# Patient Record
Sex: Male | Born: 1990 | Race: White | Hispanic: No | Marital: Single | Smoking: Former smoker
Health system: Southern US, Community
[De-identification: ages and names within clinical notes are randomized; demographics above are authoritative.]

## PROBLEM LIST (undated history)

## (undated) HISTORY — PX: SKIN CANCER EXCISION: SHX779

## (undated) HISTORY — PX: TONSILLECTOMY: SUR1361

---

## 2012-04-09 ENCOUNTER — Ambulatory Visit: Payer: Self-pay | Admitting: Internal Medicine

## 2013-12-14 ENCOUNTER — Emergency Department: Payer: Self-pay | Admitting: Emergency Medicine

## 2014-08-26 ENCOUNTER — Emergency Department: Payer: Self-pay | Admitting: Emergency Medicine

## 2014-08-26 LAB — URINALYSIS, COMPLETE
BILIRUBIN, UR: NEGATIVE
Bacteria: NONE SEEN
Glucose,UR: NEGATIVE mg/dL (ref 0–75)
LEUKOCYTE ESTERASE: NEGATIVE
Nitrite: NEGATIVE
Ph: 7 (ref 4.5–8.0)
Protein: 30
RBC,UR: 28 /HPF (ref 0–5)
Specific Gravity: 1.016 (ref 1.003–1.030)
Squamous Epithelial: NONE SEEN
WBC UR: 3 /HPF (ref 0–5)

## 2014-08-26 LAB — COMPREHENSIVE METABOLIC PANEL
ALT: 20 U/L
ANION GAP: 10 (ref 7–16)
Albumin: 4.6 g/dL (ref 3.4–5.0)
Alkaline Phosphatase: 77 U/L
BILIRUBIN TOTAL: 1.5 mg/dL — AB (ref 0.2–1.0)
BUN: 14 mg/dL (ref 7–18)
CALCIUM: 9 mg/dL (ref 8.5–10.1)
Chloride: 107 mmol/L (ref 98–107)
Co2: 23 mmol/L (ref 21–32)
Creatinine: 1.13 mg/dL (ref 0.60–1.30)
EGFR (African American): >60
GLUCOSE: 126 mg/dL — AB (ref 65–99)
OSMOLALITY: 281 (ref 275–301)
Potassium: 3.5 mmol/L (ref 3.5–5.1)
SGOT(AST): 28 U/L (ref 15–37)
Sodium: 140 mmol/L (ref 136–145)
Total Protein: 7.3 g/dL (ref 6.4–8.2)

## 2014-08-26 LAB — CBC WITH DIFFERENTIAL/PLATELET
BASOS ABS: 0.1 10*3/uL (ref 0.0–0.1)
BASOS PCT: 1.1 %
EOS ABS: 0.5 10*3/uL (ref 0.0–0.7)
Eosinophil %: 4.7 %
HCT: 43.1 % (ref 40.0–52.0)
HGB: 14.4 g/dL (ref 13.0–18.0)
Lymphocyte #: 4.4 10*3/uL — ABNORMAL HIGH (ref 1.0–3.6)
Lymphocyte %: 39.5 %
MCH: 31.5 pg (ref 26.0–34.0)
MCHC: 33.3 g/dL (ref 32.0–36.0)
MCV: 95 fL (ref 80–100)
Monocyte #: 0.9 x10 3/mm (ref 0.2–1.0)
Monocyte %: 7.7 %
Neutrophil #: 5.2 10*3/uL (ref 1.4–6.5)
Neutrophil %: 47 %
PLATELETS: 389 10*3/uL (ref 150–440)
RBC: 4.56 10*6/uL (ref 4.40–5.90)
RDW: 12.9 % (ref 11.5–14.5)
WBC: 11.1 10*3/uL — ABNORMAL HIGH (ref 3.8–10.6)

## 2014-12-21 IMAGING — CT CT CERVICAL SPINE WITHOUT CONTRAST
3 of 5 series · 10 of 33 positions shown, 12 images · non-contrast
Comparison: None.

CLINICAL DATA: Headache and neck pain after falling from pull up
bar.

EXAM:
CT HEAD WITHOUT CONTRAST
CT CERVICAL SPINE WITHOUT CONTRAST
TECHNIQUE: Multidetector CT imaging of the head and cervical spine was
performed following the standard protocol without intravenous
contrast. Multiplanar CT image reconstructions of the cervical spine
were also generated.

[Series 6: sag bone · sagittal · 0.18mm/px · 5 of 46 slices shown, 6 images]
[im 16/46  bone]
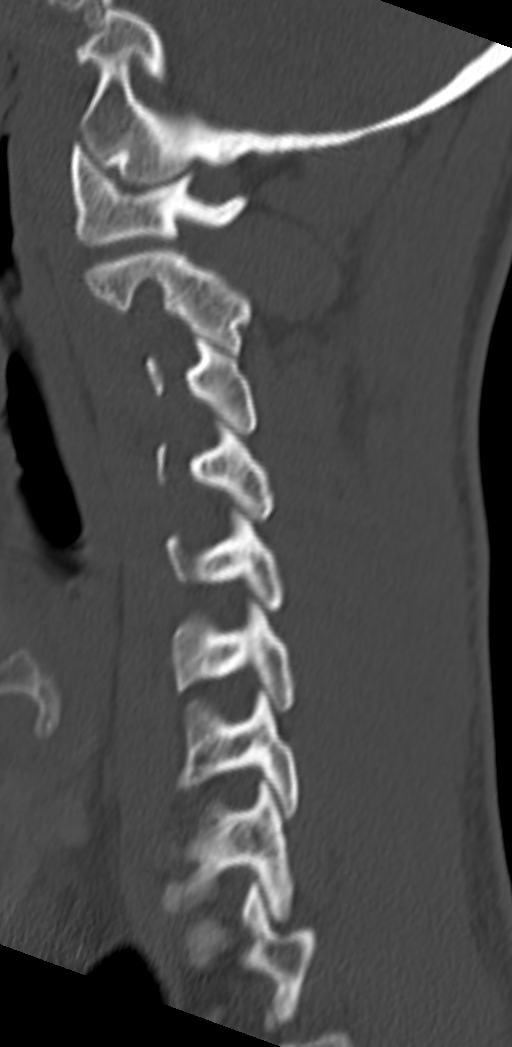
[im 19/46  bone]
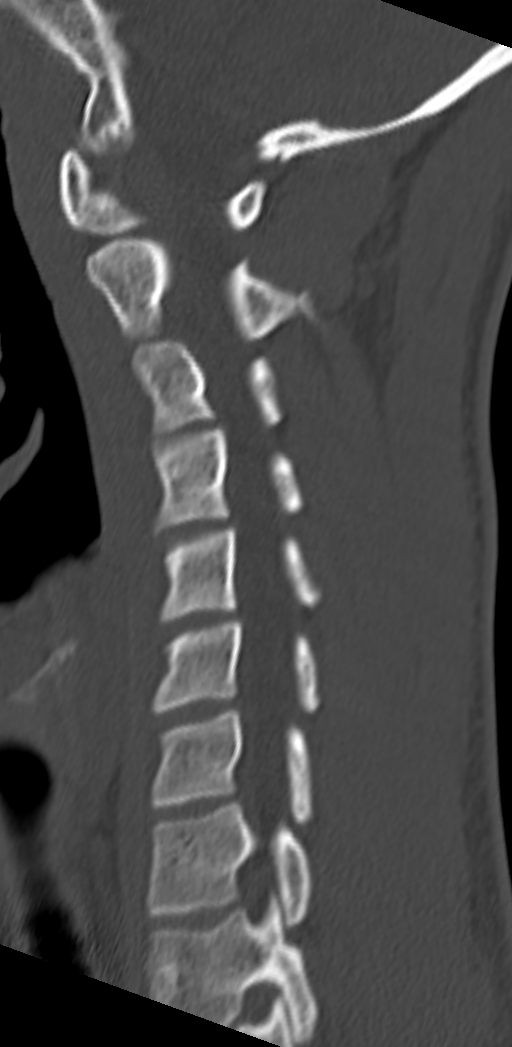
[im 23/46  soft-tissue]
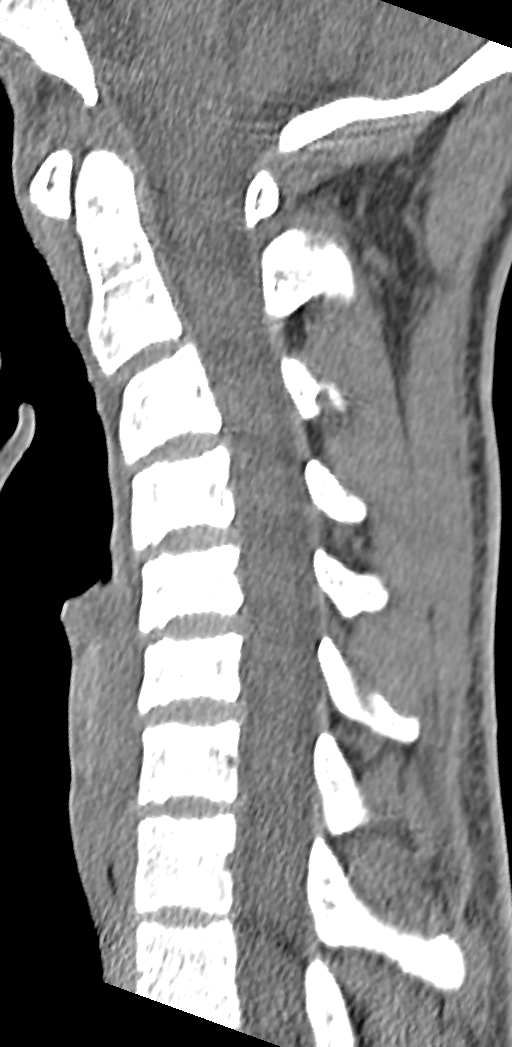
[im 23/46  bone]
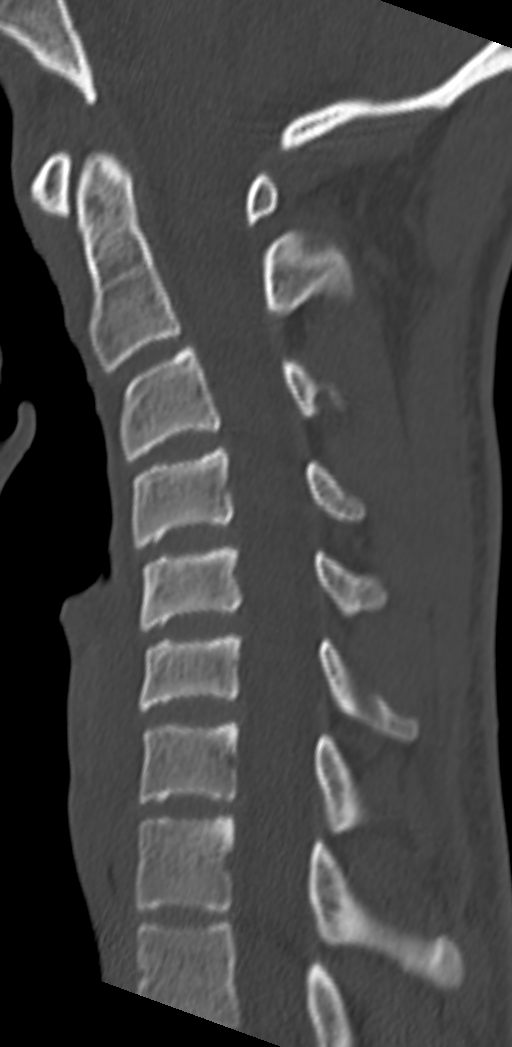
[im 27/46  bone]
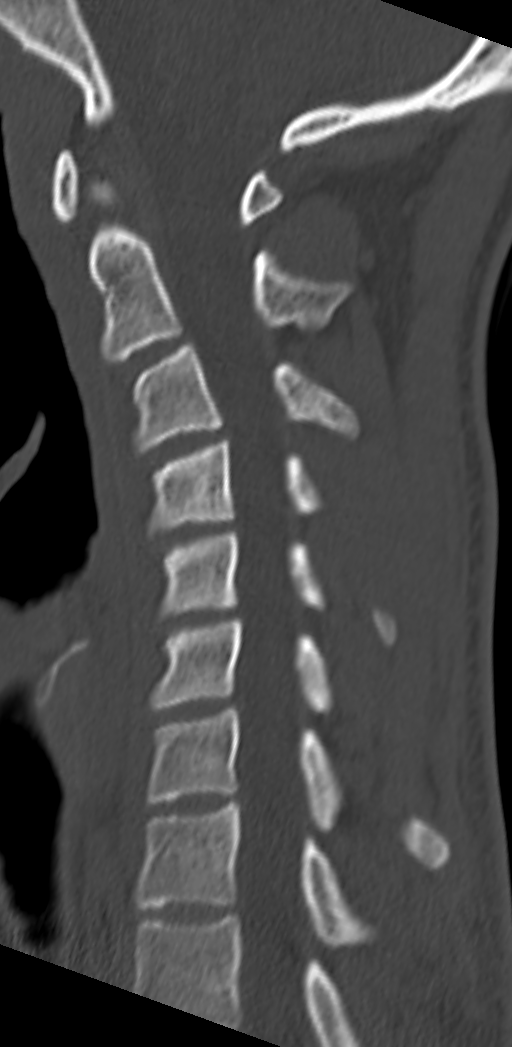
[im 31/46  bone]
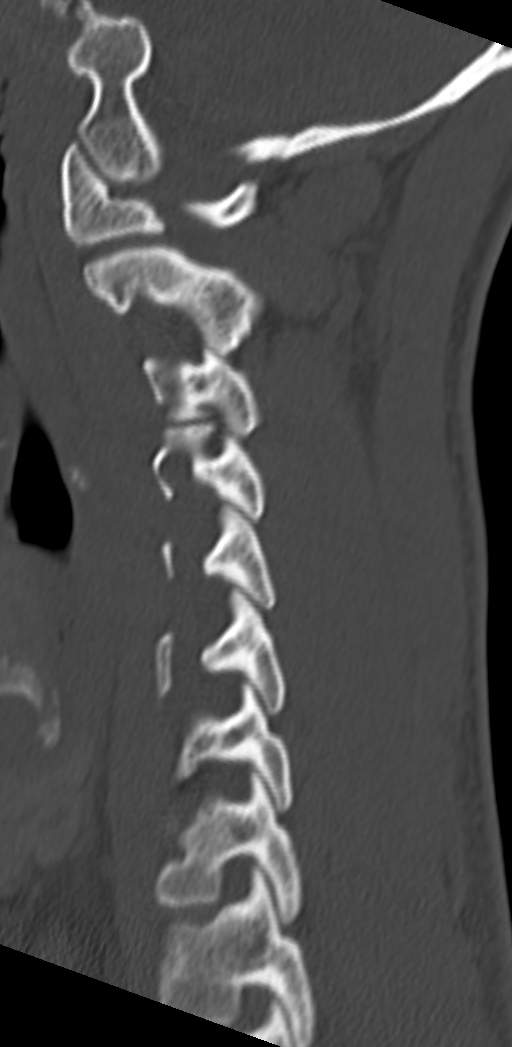

[Series 7: cor bone · coronal · 0.22mm/px · 3 of 44 slices shown]
[im 9/44  bone]
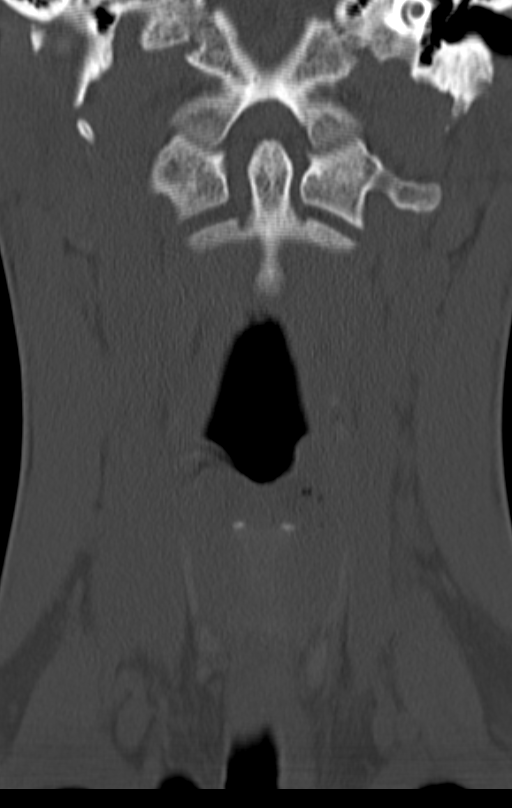
[im 18/44  bone]
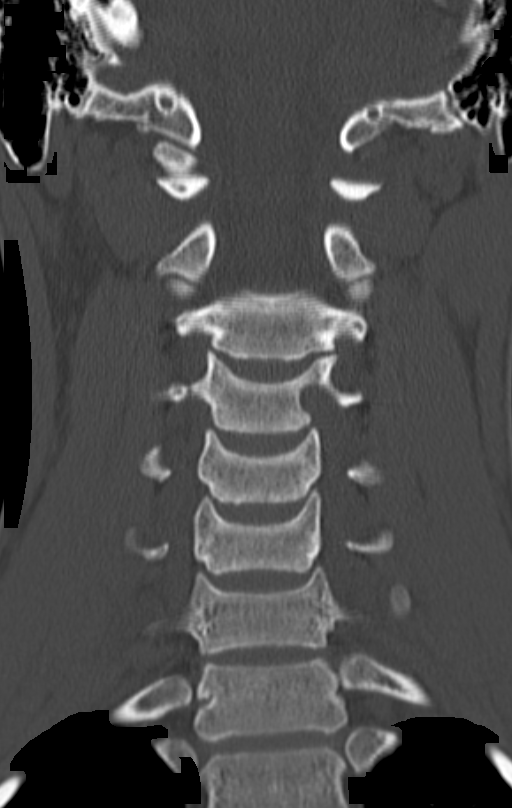
[im 26/44  bone]
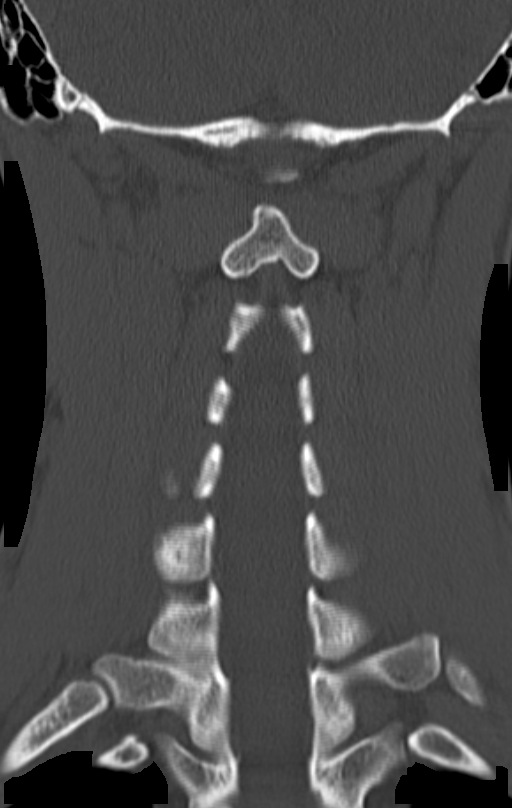

[Series 8: orthogonal axials · axial · 0.15mm/px · z∈[-142,-77]mm · 2 of 88 slices shown, 3 images]
[im 35/88  soft-tissue]
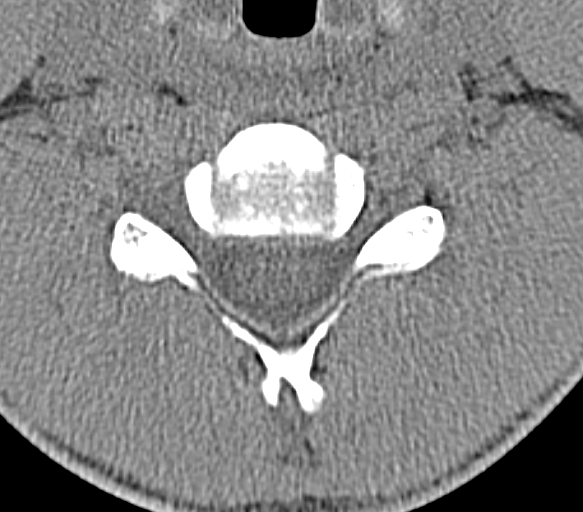
[im 35/88  bone]
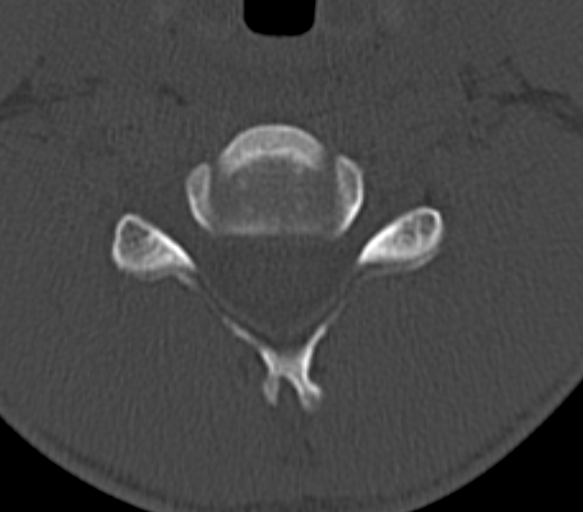
[im 70/88  bone]
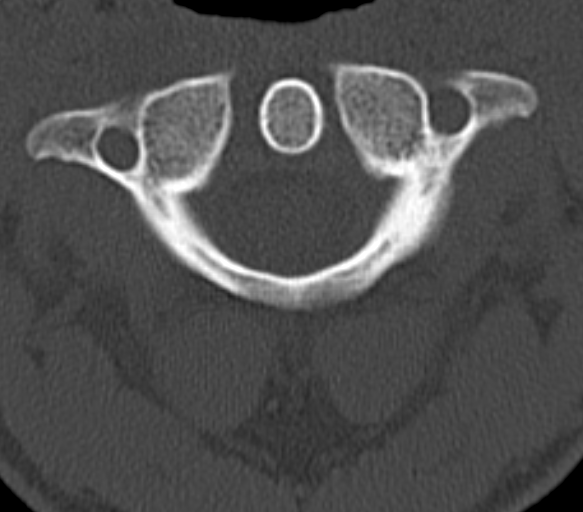

[10 of 33 positions shown; findings below may reference images not displayed]

FINDINGS: CT HEAD FINDINGS

There is no evidence of acute intracranial hemorrhage, mass lesion,
brain edema or extra-axial fluid collection. The ventricles and
subarachnoid spaces are appropriately sized for age. There is no CT
evidence of acute cortical infarction.

The visualized paranasal sinuses, mastoid air cells and middle ears
are clear. The calvarium is intact.

CT CERVICAL SPINE FINDINGS

There is moderate reversal of the usual cervical lordosis. However,
no focal angulation, listhesis or paraspinal abnormality is seen.
The disc spaces and neuro foramina appear patent.
IMPRESSION: 1. No acute intracranial or calvarial findings.
2. Reversal of cervical lordosis may be positional or secondary to
muscle spasm. There is no evidence of acute fracture or traumatic
subluxation.

## 2015-07-07 ENCOUNTER — Other Ambulatory Visit: Payer: Self-pay | Admitting: Physician Assistant

## 2015-07-07 ENCOUNTER — Ambulatory Visit
Admission: RE | Admit: 2015-07-07 | Discharge: 2015-07-07 | Disposition: A | Discharge status: Home / Self Care | Payer: Managed Care, Other (non HMO) | Source: Ambulatory Visit | Attending: Physician Assistant | Admitting: Physician Assistant

## 2015-07-07 DIAGNOSIS — R590 Localized enlarged lymph nodes: Secondary | ICD-10-CM | POA: Insufficient documentation

## 2015-07-07 DIAGNOSIS — R911 Solitary pulmonary nodule: Secondary | ICD-10-CM | POA: Diagnosis not present

## 2015-07-07 DIAGNOSIS — R0602 Shortness of breath: Secondary | ICD-10-CM

## 2015-07-07 DIAGNOSIS — K760 Fatty (change of) liver, not elsewhere classified: Secondary | ICD-10-CM | POA: Insufficient documentation

## 2015-07-07 MED ORDER — IOHEXOL 350 MG/ML SOLN
75.0000 mL | Freq: Once | INTRAVENOUS | Status: AC | PRN
  Administered 2015-07-07: 75 mL via INTRAVENOUS

## 2019-02-05 ENCOUNTER — Telehealth: Payer: Self-pay | Admitting: Family Medicine

## 2019-02-05 NOTE — Telephone Encounter (Signed)
Called pt to go over script screening for covid-19 no answer left vm °

## 2019-02-06 ENCOUNTER — Ambulatory Visit: Payer: BLUE CROSS/BLUE SHIELD | Admitting: Family Medicine

## 2019-02-06 ENCOUNTER — Encounter: Payer: Self-pay | Admitting: Family Medicine

## 2019-02-06 ENCOUNTER — Other Ambulatory Visit: Payer: Self-pay

## 2019-02-06 VITALS — BP 111/72 | HR 54 | Temp 98.2°F

## 2019-02-06 DIAGNOSIS — Z7689 Persons encountering health services in other specified circumstances: Secondary | ICD-10-CM | POA: Diagnosis not present

## 2019-02-06 DIAGNOSIS — B360 Pityriasis versicolor: Secondary | ICD-10-CM | POA: Diagnosis not present

## 2019-02-06 MED ORDER — KETOCONAZOLE 2 % EX CREA: 1.0000 "application " | TOPICAL_CREAM | Freq: Two times a day (BID) | CUTANEOUS | 1 refills | Status: AC | PRN

## 2019-02-06 NOTE — Progress Notes (Signed)
   BP 111/72   Pulse (!) 54   Temp 98.2 F (36.8 C) (Oral)   SpO2 98%    Subjective:    Patient ID: Jeremiah Lee, male    DOB: 05-15-1991, 28 y.o.   MRN: [id number]  HPI: Jeremiah Lee is a 28 y.o. male  Chief Complaint  Patient presents with  . Establish Care  . Rash    on upper back and neck, off and on for 3 years   Here today to establish care.  Dealing with a rash off and on x 3 years. Was diagnosed with tinea versicolor which cleared with a week on diflucan. Center of upper back, now on chest and neck. Itchy at bedtime. Has been using some lotion on the back with minimal relief. Tried a steroid cream without benefit.   No other known medical problems. Does not recall when his last CPE was, but knows it's at least a few years ago.   Relevant past medical, surgical, family and social history reviewed and updated as indicated. Interim medical history since our last visit reviewed. Allergies and medications reviewed and updated.  Review of Systems  Per HPI unless specifically indicated above     Objective:    BP 111/72   Pulse (!) 54   Temp 98.2 F (36.8 C) (Oral)   SpO2 98%   Wt Readings from Last 3 Encounters:  No data found for Wt    Physical Exam Vitals signs and nursing note reviewed.  Constitutional:      Appearance: Normal appearance.  HENT:     Head: Atraumatic.  Eyes:     Extraocular Movements: Extraocular movements intact.     Conjunctiva/sclera: Conjunctivae normal.  Neck:     Musculoskeletal: Normal range of motion and neck supple.  Cardiovascular:     Rate and Rhythm: Normal rate and regular rhythm.  Pulmonary:     Effort: Pulmonary effort is normal.     Breath sounds: Normal breath sounds.  Musculoskeletal: Normal range of motion.  Skin:    General: Skin is warm and dry.     Findings: Rash (macular hyperpigmentation across upper back and extending to neck and chest. Some peeling/dryness in places) present.  Neurological:   General: No focal deficit present.     Mental Status: He is oriented to person, place, and time.  Psychiatric:        Mood and Affect: Mood normal.        Thought Content: Thought content normal.        Judgment: Judgment normal.    No results found for this or any previous visit.    Assessment & Plan:   Problem List Items Addressed This Visit    None    Visit Diagnoses    Encounter to establish care    -  Primary   Tinea versicolor       Tx with ketoconazole cream, moisturizers. Pt switching to a tea tree oil wash. F/u if not improving       Follow up plan: Return for CPE.

## 2019-02-20 ENCOUNTER — Ambulatory Visit (INDEPENDENT_AMBULATORY_CARE_PROVIDER_SITE_OTHER): Payer: BLUE CROSS/BLUE SHIELD | Admitting: Family Medicine

## 2019-02-20 ENCOUNTER — Encounter: Payer: Self-pay | Admitting: Family Medicine

## 2019-02-20 ENCOUNTER — Other Ambulatory Visit: Payer: Self-pay

## 2019-02-20 VITALS — BP 109/72 | HR 60 | Temp 97.9°F | Ht 67.0 in | Wt 125.0 lb

## 2019-02-20 DIAGNOSIS — Z0000 Encounter for general adult medical examination without abnormal findings: Secondary | ICD-10-CM

## 2019-02-20 DIAGNOSIS — Z114 Encounter for screening for human immunodeficiency virus [HIV]: Secondary | ICD-10-CM

## 2019-02-20 DIAGNOSIS — Z113 Encounter for screening for infections with a predominantly sexual mode of transmission: Secondary | ICD-10-CM

## 2019-02-20 LAB — UA/M W/RFLX CULTURE, ROUTINE
Bilirubin, UA: NEGATIVE
Glucose, UA: NEGATIVE
Ketones, UA: NEGATIVE
Leukocytes,UA: NEGATIVE
Nitrite, UA: NEGATIVE
Protein,UA: NEGATIVE
Specific Gravity, UA: 1.025 (ref 1.005–1.030)
Urobilinogen, Ur: 0.2 mg/dL (ref 0.2–1.0)
pH, UA: 6 (ref 5.0–7.5)

## 2019-02-20 LAB — MICROSCOPIC EXAMINATION
Bacteria, UA: NONE SEEN
WBC, UA: NONE SEEN /hpf (ref 0–5)

## 2019-02-20 NOTE — Progress Notes (Signed)
BP 109/72   Pulse 60   Temp 97.9 F (36.6 C) (Oral)   Ht 5\' 7"  (1.702 m)   Wt 125 lb (56.7 kg)   SpO2 98%   BMI 19.58 kg/m    Subjective:    Patient ID: Jeremiah Lee, male    DOB: 08-01-1991, 28 y.o.   MRN: [id number]  HPI: Jeremiah Lee is a 28 y.o. male presenting on 02/20/2019 for comprehensive medical examination. Current medical complaints include:none  He currently lives with: Interim Problems from his last visit: no  Depression Screen done today and results listed below:  [facility]2019 02/06/2019  Decreased Interest 0 0  Down, Depressed, Hopeless 0 0  PHQ - 2 Score 0 0  Altered sleeping 2 -  Tired, decreased energy 0 -  Change in appetite 0 -  Feeling bad or failure about yourself  0 -  Trouble concentrating 0 -  Moving slowly or fidgety/restless 0 -  Suicidal thoughts 0 -  PHQ-9 Score 2 -    The patient does not have a history of falls. I did not complete a risk assessment for falls. A plan of care for falls was not documented.   Past Medical History:  History reviewed. No pertinent past medical history.  Surgical History:  Past Surgical History:  Procedure Laterality Date  . SKIN CANCER EXCISION    . TONSILLECTOMY      Medications:  Current Outpatient Medications on File Prior to Visit  Medication Sig  . ketoconazole (NIZORAL) 2 % cream Apply 1 application topically 2 (two) times daily as needed for irritation.   No current facility-administered medications on file prior to visit.     Allergies:  Allergies  Allergen Reactions  . Amoxil [Amoxicillin]     hives  . Duricef [Cefadroxil]     Hives   . Keflex [Cephalexin]     hives    Social History:  Social History   Socioeconomic History  . Marital status: Single    Spouse name: Not on file  . Number of children: Not on file  . Years of education: Not on file  . Highest education level: Not on file  Occupational History  . Not on file  Social Needs   . Financial resource strain: Not on file  . Food insecurity    Worry: Not on file    Inability: Not on file  . Transportation needs    Medical: Not on file    Non-medical: Not on file  Tobacco Use  . Smoking status: Former Games developermoker  . Smokeless tobacco: Current User  Substance and Sexual Activity  . Alcohol use: Yes    Comment: socially / on occasion  . Drug use: Never  . Sexual activity: Yes  Lifestyle  . Physical activity    Days per week: Not on file    Minutes per session: Not on file  . Stress: Not on file  Relationships  . Social Musicianconnections    Talks on phone: Not on file    Gets together: Not on file    Attends religious service: Not on file    Active member of club or organization: Not on file    Attends meetings of clubs or organizations: Not on file    Relationship status: Not on file  . Intimate partner violence    Fear of current or ex partner: Not on file    Emotionally abused: Not on file    Physically abused:  Not on file    Forced sexual activity: Not on file  Other Topics Concern  . Not on file  Social History Narrative  . Not on file   Social History   Tobacco Use  Smoking Status Former Smoker  Smokeless Tobacco Current User   Social History   Substance and Sexual Activity  Alcohol Use Yes   Comment: socially / on occasion    Family History:  Family History  Problem Relation Age of Onset  . Asthma Father     Past medical history, surgical history, medications, allergies, family history and social history reviewed with patient today and changes made to appropriate areas of the chart.   Review of Systems - General ROS: negative Psychological ROS: negative Ophthalmic ROS: negative ENT ROS: negative Allergy and Immunology ROS: negative Hematological and Lymphatic ROS: negative Endocrine ROS: negative Breast ROS: negative for breast lumps Respiratory ROS: no cough, shortness of breath, or wheezing Cardiovascular ROS: no chest pain or  dyspnea on exertion Gastrointestinal ROS: no abdominal pain, change in bowel habits, or black or bloody stools Genito-Urinary ROS: no dysuria, trouble voiding, or hematuria Musculoskeletal ROS: negative Neurological ROS: no TIA or stroke symptoms Dermatological ROS: negative All other ROS negative except what is listed above and in the HPI.      Objective:    BP 109/72   Pulse 60   Temp 97.9 F (36.6 C) (Oral)   Ht 5\' 7"  (1.702 m)   Wt 125 lb (56.7 kg)   SpO2 98%   BMI 19.58 kg/m   Wt Readings from Last 3 Encounters:  02/20/19 125 lb (56.7 kg)    Physical Exam Vitals signs and nursing note reviewed.  Constitutional:      General: He is not in acute distress.    Appearance: He is well-developed.  HENT:     Head: Atraumatic.     Right Ear: Tympanic membrane and external ear normal.     Left Ear: Tympanic membrane and external ear normal.     Nose: Nose normal.     Mouth/Throat:     Mouth: Mucous membranes are moist.     Pharynx: Oropharynx is clear.  Eyes:     General: No scleral icterus.    Conjunctiva/sclera: Conjunctivae normal.     Pupils: Pupils are equal, round, and reactive to light.  Neck:     Musculoskeletal: Normal range of motion and neck supple.  Cardiovascular:     Rate and Rhythm: Normal rate and regular rhythm.     Heart sounds: Normal heart sounds. No murmur.  Pulmonary:     Effort: Pulmonary effort is normal. No respiratory distress.     Breath sounds: Normal breath sounds.  Abdominal:     General: Bowel sounds are normal. There is no distension.     Palpations: Abdomen is soft. There is no mass.     Tenderness: There is no abdominal tenderness. There is no guarding.  Genitourinary:    Comments: GU exam declined Musculoskeletal: Normal range of motion.        General: No tenderness.  Skin:    General: Skin is warm and dry.     Findings: No rash.  Neurological:     General: No focal deficit present.     Mental Status: He is alert and oriented  to person, place, and time.     Deep Tendon Reflexes: Reflexes are normal and symmetric.  Psychiatric:        Mood and Affect: Mood  normal.        Behavior: Behavior normal.        Thought Content: Thought content normal.        Judgment: Judgment normal.    No results found for this or any previous visit.    Assessment & Plan:   Problem List Items Addressed This Visit    None    Visit Diagnoses    Annual physical exam    -  Primary   Relevant Orders   CBC with Differential/Platelet   Comprehensive metabolic panel   Lipid Panel w/o Chol/HDL Ratio   UA/M w/rflx Culture, Routine   Encounter for screening for HIV       Relevant Orders   HIV Antibody (routine testing w rflx)   Routine screening for STI (sexually transmitted infection)       Relevant Orders   HSV(herpes simplex vrs) 1+2 ab-IgG   RPR   GC/Chlamydia Probe Amp       Discussed aspirin prophylaxis for myocardial infarction prevention and decision was it was not indicated  LABORATORY TESTING:  Health maintenance labs ordered today as discussed above.   The natural history of prostate cancer and ongoing controversy regarding screening and potential treatment outcomes of prostate cancer has been discussed with the patient. The meaning of a false positive PSA and a false negative PSA has been discussed. He indicates understanding of the limitations of this screening test and wishes not to proceed with screening PSA testing.   IMMUNIZATIONS:   - Tdap: Tetanus vaccination status reviewed: due, but clinic currently out of stock. Will come back at a later date for this. - Influenza: Postponed to flu season  PATIENT COUNSELING:    Sexuality: Discussed sexually transmitted diseases, partner selection, use of condoms, avoidance of unintended pregnancy  and contraceptive alternatives.   Advised to avoid cigarette smoking.  I discussed with the patient that most people either abstain from alcohol or drink within safe  limits (<=14/week and <=4 drinks/occasion for males, <=7/weeks and <= 3 drinks/occasion for females) and that the risk for alcohol disorders and other health effects rises proportionally with the number of drinks per week and how often a drinker exceeds daily limits.  Discussed cessation/primary prevention of drug use and availability of treatment for abuse.   Diet: Encouraged to adjust caloric intake to maintain  or achieve ideal body weight, to reduce intake of dietary saturated fat and total fat, to limit sodium intake by avoiding high sodium foods and not adding table salt, and to maintain adequate dietary potassium and calcium preferably from fresh fruits, vegetables, and low-fat dairy products.    stressed the importance of regular exercise  Injury prevention: Discussed safety belts, safety helmets, smoke detector, smoking near bedding or upholstery.   Dental health: Discussed importance of regular tooth brushing, flossing, and dental visits.   Follow up plan: NEXT PREVENTATIVE PHYSICAL DUE IN 1 YEAR. Return in about 1 year (around 02/20/2020) for CPE.

## 2019-02-21 LAB — COMPREHENSIVE METABOLIC PANEL
ALT: 13 IU/L (ref 0–44)
AST: 20 IU/L (ref 0–40)
Albumin/Globulin Ratio: 2.6 — ABNORMAL HIGH (ref 1.2–2.2)
Albumin: 4.9 g/dL (ref 4.1–5.2)
Alkaline Phosphatase: 68 IU/L (ref 39–117)
BUN/Creatinine Ratio: 11 (ref 9–20)
BUN: 13 mg/dL (ref 6–20)
Bilirubin Total: 1 mg/dL (ref 0.0–1.2)
CO2: 23 mmol/L (ref 20–29)
Calcium: 9.3 mg/dL (ref 8.7–10.2)
Chloride: 105 mmol/L (ref 96–106)
Creatinine, Ser: 1.18 mg/dL (ref 0.76–1.27)
GFR calc Af Amer: 97 mL/min/{1.73_m2} (ref >59)
GFR calc non Af Amer: 84 mL/min/{1.73_m2} (ref >59)
Globulin, Total: 1.9 g/dL (ref 1.5–4.5)
Glucose: 84 mg/dL (ref 65–99)
Potassium: 4.2 mmol/L (ref 3.5–5.2)
Sodium: 142 mmol/L (ref 134–144)
Total Protein: 6.8 g/dL (ref 6.0–8.5)

## 2019-02-21 LAB — HSV(HERPES SIMPLEX VRS) I + II AB-IGG
HSV 1 Glycoprotein G Ab, IgG: >62.2 index — ABNORMAL HIGH (ref 0.00–0.90)
HSV 2 IgG, Type Spec: <0.91 index (ref 0.00–0.90)

## 2019-02-21 LAB — CBC WITH DIFFERENTIAL/PLATELET
Basophils Absolute: 0.1 10*3/uL (ref 0.0–0.2)
Basos: 1 %
EOS (ABSOLUTE): 0.3 10*3/uL (ref 0.0–0.4)
Eos: 5 %
Hematocrit: 43.7 % (ref 37.5–51.0)
Hemoglobin: 14.6 g/dL (ref 13.0–17.7)
Immature Grans (Abs): 0 10*3/uL (ref 0.0–0.1)
Immature Granulocytes: 0 %
Lymphocytes Absolute: 2 10*3/uL (ref 0.7–3.1)
Lymphs: 35 %
MCH: 30.8 pg (ref 26.6–33.0)
MCHC: 33.4 g/dL (ref 31.5–35.7)
MCV: 92 fL (ref 79–97)
Monocytes Absolute: 0.5 10*3/uL (ref 0.1–0.9)
Monocytes: 9 %
Neutrophils Absolute: 2.9 10*3/uL (ref 1.4–7.0)
Neutrophils: 50 %
Platelets: 298 10*3/uL (ref 150–450)
RBC: 4.74 x10E6/uL (ref 4.14–5.80)
RDW: 11.9 % (ref 11.6–15.4)
WBC: 5.8 10*3/uL (ref 3.4–10.8)

## 2019-02-21 LAB — LIPID PANEL W/O CHOL/HDL RATIO
Cholesterol, Total: 154 mg/dL (ref 100–199)
HDL: 63 mg/dL (ref >39)
LDL Calculated: 78 mg/dL (ref 0–99)
Triglycerides: 67 mg/dL (ref 0–149)
VLDL Cholesterol Cal: 13 mg/dL (ref 5–40)

## 2019-02-21 LAB — HIV ANTIBODY (ROUTINE TESTING W REFLEX): HIV Screen 4th Generation wRfx: NONREACTIVE

## 2019-02-21 LAB — RPR: RPR Ser Ql: NONREACTIVE

## 2019-02-23 LAB — GC/CHLAMYDIA PROBE AMP
Chlamydia trachomatis, NAA: NEGATIVE
Neisseria Gonorrhoeae by PCR: NEGATIVE

## 2019-06-22 NOTE — Telephone Encounter (Signed)
Can someone check for availability and contact patient.

## 2019-06-22 NOTE — Telephone Encounter (Signed)
Appt scheduled

## 2019-06-22 NOTE — Telephone Encounter (Signed)
Pt does not want to do a virtual appt. He stated that it wasn't bad until he put a warm cloth on it overnight and this morning he woke up and it was extremely swollen. He is available this afternoon or tomorrow morning but wants to come in.

## 2019-06-23 ENCOUNTER — Encounter: Payer: Self-pay | Admitting: Family Medicine

## 2019-06-23 ENCOUNTER — Ambulatory Visit (INDEPENDENT_AMBULATORY_CARE_PROVIDER_SITE_OTHER): Payer: BLUE CROSS/BLUE SHIELD | Admitting: Family Medicine

## 2019-06-23 ENCOUNTER — Other Ambulatory Visit: Payer: Self-pay

## 2019-06-23 DIAGNOSIS — H01004 Unspecified blepharitis left upper eyelid: Secondary | ICD-10-CM | POA: Diagnosis not present

## 2019-06-23 MED ORDER — POLYMYXIN B-TRIMETHOPRIM 10000-0.1 UNIT/ML-% OP SOLN: 1.0000 [drp] | OPHTHALMIC | 0 refills | Status: AC

## 2019-06-23 NOTE — Progress Notes (Signed)
There were no vitals taken for this visit.   Subjective:    Patient ID: Jeremiah Lee, male    DOB: 12/11/1990, 28 y.o.   MRN: [id number]  HPI: Jeremiah Lee is a 28 y.o. male  Chief Complaint  Patient presents with  . Eye Problem    pt states he started having some soreness and itching of his left eye last week, states he fell asleep with a warm cloth on his eye on Sunday and woke up yesterday morning with it crusty     . This visit was completed via WebEx due to the restrictions of the COVID-19 pandemic. All issues as above were discussed and addressed. Physical exam was done as above through visual confirmation on WebEx. If it was felt that the patient should be evaluated in the office, they were directed there. The patient verbally consented to this visit. . Location of the patient: home . Location of the provider: home . Those involved with this call:  . Provider: Merrie Roof, PA-C . CMA: Tiffany Reel, CMA . Front Desk/Registration: Jill Side  . Time spent on call: 15 minutes with patient face to face via video conference. More than 50% of this time was spent in counseling and coordination of care. 5 minutes total spent in review of patient's record and preparation of their chart. I verified patient identity using two factors (patient name and date of birth). Patient consents verbally to being seen via telemedicine visit today.   Started having left eyelid soreness and itching, now having thick drainage and crusting. Using warm compresses which does help temporarily clear things up. Denies sick contacts, visual changes, headaches, fevers.   Relevant past medical, surgical, family and social history reviewed and updated as indicated. Interim medical history since our last visit reviewed. Allergies and medications reviewed and updated.  Review of Systems  Per HPI unless specifically indicated above     Objective:    There were no vitals taken for this  visit.  Wt Readings from Last 3 Encounters:  02/20/19 125 lb (56.7 kg)    Physical Exam Vitals signs and nursing note reviewed.  Constitutional:      General: He is not in acute distress.    Appearance: Normal appearance.  HENT:     Head: Atraumatic.     Right Ear: External ear normal.     Left Ear: External ear normal.     Nose: Nose normal. No congestion.     Mouth/Throat:     Mouth: Mucous membranes are moist.     Pharynx: Oropharynx is clear.  Eyes:     General:        Left eye: Discharge present.    Extraocular Movements: Extraocular movements intact.     Comments: Left upper eyelid erythematous and mildly edematous, left conjunctiva mildly erythematous  Neck:     Musculoskeletal: Normal range of motion.  Pulmonary:     Effort: Pulmonary effort is normal. No respiratory distress.  Musculoskeletal: Normal range of motion.  Skin:    General: Skin is dry.     Findings: No erythema or rash.  Neurological:     Mental Status: He is oriented to person, place, and time.  Psychiatric:        Mood and Affect: Mood normal.        Thought Content: Thought content normal.        Judgment: Judgment normal.    Results for orders placed or performed in visit on  02/20/19  GC/Chlamydia Probe Amp   Specimen: Urine   UR  Result Value Ref Range   Chlamydia trachomatis, NAA Negative Negative   Neisseria Gonorrhoeae by PCR Negative Negative  Microscopic Examination   URINE  Result Value Ref Range   WBC, UA None seen 0 - 5 /hpf   RBC 0-2 0 - 2 /hpf   Epithelial Cells (non renal) 0-10 0 - 10 /hpf   Bacteria, UA None seen None seen/Few  HIV Antibody (routine testing w rflx)  Result Value Ref Range   HIV Screen 4th Generation wRfx Non Reactive Non Reactive  CBC with Differential/Platelet  Result Value Ref Range   WBC 5.8 3.4 - 10.8 x10E3/uL   RBC 4.74 4.14 - 5.80 x10E6/uL   Hemoglobin 14.6 13.0 - 17.7 g/dL   Hematocrit 66.5 99.3 - 51.0 %   MCV 92 79 - 97 fL   MCH 30.8 26.6 -  33.0 pg   MCHC 33.4 31.5 - 35.7 g/dL   RDW 57.0 17.7 - 93.9 %   Platelets 298 150 - 450 x10E3/uL   Neutrophils 50 Not Estab. %   Lymphs 35 Not Estab. %   Monocytes 9 Not Estab. %   Eos 5 Not Estab. %   Basos 1 Not Estab. %   Neutrophils Absolute 2.9 1.4 - 7.0 x10E3/uL   Lymphocytes Absolute 2.0 0.7 - 3.1 x10E3/uL   Monocytes Absolute 0.5 0.1 - 0.9 x10E3/uL   EOS (ABSOLUTE) 0.3 0.0 - 0.4 x10E3/uL   Basophils Absolute 0.1 0.0 - 0.2 x10E3/uL   Immature Granulocytes 0 Not Estab. %   Immature Grans (Abs) 0.0 0.0 - 0.1 x10E3/uL  Comprehensive metabolic panel  Result Value Ref Range   Glucose 84 65 - 99 mg/dL   BUN 13 6 - 20 mg/dL   Creatinine, Ser 0.30 0.76 - 1.27 mg/dL   GFR calc non Af Amer 84 >59 mL/min/1.73   GFR calc Af Amer 97 >59 mL/min/1.73   BUN/Creatinine Ratio 11 9 - 20   Sodium 142 134 - 144 mmol/L   Potassium 4.2 3.5 - 5.2 mmol/L   Chloride 105 96 - 106 mmol/L   CO2 23 20 - 29 mmol/L   Calcium 9.3 8.7 - 10.2 mg/dL   Total Protein 6.8 6.0 - 8.5 g/dL   Albumin 4.9 4.1 - 5.2 g/dL   Globulin, Total 1.9 1.5 - 4.5 g/dL   Albumin/Globulin Ratio 2.6 (H) 1.2 - 2.2   Bilirubin Total 1.0 0.0 - 1.2 mg/dL   Alkaline Phosphatase 68 39 - 117 IU/L   AST 20 0 - 40 IU/L   ALT 13 0 - 44 IU/L  Lipid Panel w/o Chol/HDL Ratio  Result Value Ref Range   Cholesterol, Total 154 100 - 199 mg/dL   Triglycerides 67 0 - 149 mg/dL   HDL 63 >09 mg/dL   VLDL Cholesterol Cal 13 5 - 40 mg/dL   LDL Calculated 78 0 - 99 mg/dL  UA/M w/rflx Culture, Routine   Specimen: Urine   URINE  Result Value Ref Range   Specific Gravity, UA 1.025 1.005 - 1.030   pH, UA 6.0 5.0 - 7.5   Color, UA Yellow Yellow   Appearance Ur Clear Clear   Leukocytes,UA Negative Negative   Protein,UA Negative Negative/Trace   Glucose, UA Negative Negative   Ketones, UA Negative Negative   RBC, UA 1+ (A) Negative   Bilirubin, UA Negative Negative   Urobilinogen, Ur 0.2 0.2 - 1.0 mg/dL   Nitrite, UA  Negative Negative    Microscopic Examination See below:   HSV(herpes simplex vrs) 1+2 ab-IgG  Result Value Ref Range   HSV 1 Glycoprotein G Ab, IgG >62.20 (H) 0.00 - 0.90 index   HSV 2 IgG, Type Spec <0.91 0.00 - 0.90 index  RPR  Result Value Ref Range   RPR Ser Ql Non Reactive Non Reactive      Assessment & Plan:   Problem List Items Addressed This Visit    None    Visit Diagnoses    Blepharitis of left upper eyelid, unspecified type    -  Primary   Continue warm compresses, start polytrim drops. Good hand hygiene discussed. F/u if not improving       Follow up plan: Return if symptoms worsen or fail to improve.

## 2023-11-07 ENCOUNTER — Other Ambulatory Visit: Payer: Self-pay

## 2023-11-07 ENCOUNTER — Ambulatory Visit: Payer: Self-pay | Admitting: Nurse Practitioner

## 2023-11-07 ENCOUNTER — Emergency Department

## 2023-11-07 ENCOUNTER — Encounter: Payer: Self-pay | Admitting: Intensive Care

## 2023-11-07 ENCOUNTER — Emergency Department
Admission: EM | Admit: 2023-11-07 | Discharge: 2023-11-07 | Disposition: A | Discharge status: Home / Self Care | Attending: Emergency Medicine | Admitting: Emergency Medicine

## 2023-11-07 DIAGNOSIS — R10A0 Flank pain, unspecified side: Secondary | ICD-10-CM

## 2023-11-07 DIAGNOSIS — K8020 Calculus of gallbladder without cholecystitis without obstruction: Secondary | ICD-10-CM | POA: Diagnosis not present

## 2023-11-07 DIAGNOSIS — R109 Unspecified abdominal pain: Secondary | ICD-10-CM

## 2023-11-07 DIAGNOSIS — N200 Calculus of kidney: Secondary | ICD-10-CM

## 2023-11-07 LAB — URINALYSIS, ROUTINE W REFLEX MICROSCOPIC
Bilirubin Urine: NEGATIVE
Glucose, UA: NEGATIVE mg/dL
Hgb urine dipstick: NEGATIVE
Ketones, ur: NEGATIVE mg/dL
Leukocytes,Ua: NEGATIVE
Nitrite: NEGATIVE
Protein, ur: NEGATIVE mg/dL
Specific Gravity, Urine: 1.021 (ref 1.005–1.030)
pH: 5 (ref 5.0–8.0)

## 2023-11-07 LAB — COMPREHENSIVE METABOLIC PANEL WITH GFR
ALT: 14 U/L (ref 0–44)
AST: 22 U/L (ref 15–41)
Albumin: 4.4 g/dL (ref 3.5–5.0)
Alkaline Phosphatase: 65 U/L (ref 38–126)
Anion gap: 9 (ref 5–15)
BUN: 14 mg/dL (ref 6–20)
CO2: 24 mmol/L (ref 22–32)
Calcium: 9 mg/dL (ref 8.9–10.3)
Chloride: 106 mmol/L (ref 98–111)
Creatinine, Ser: 0.84 mg/dL (ref 0.61–1.24)
GFR, Estimated: >60 mL/min
Glucose, Bld: 84 mg/dL (ref 70–99)
Potassium: 4 mmol/L (ref 3.5–5.1)
Sodium: 139 mmol/L (ref 135–145)
Total Bilirubin: 1 mg/dL (ref 0.0–1.2)
Total Protein: 6.6 g/dL (ref 6.5–8.1)

## 2023-11-07 LAB — CBC WITH DIFFERENTIAL/PLATELET
Abs Immature Granulocytes: 0.03 K/uL (ref 0.00–0.07)
Basophils Absolute: 0.1 K/uL (ref 0.0–0.1)
Basophils Relative: 1 %
Eosinophils Absolute: 0.4 K/uL (ref 0.0–0.5)
Eosinophils Relative: 4 %
HCT: 42.6 % (ref 39.0–52.0)
Hemoglobin: 13.8 g/dL (ref 13.0–17.0)
Immature Granulocytes: 0 %
Lymphocytes Relative: 14 %
Lymphs Abs: 1.2 K/uL (ref 0.7–4.0)
MCH: 30.7 pg (ref 26.0–34.0)
MCHC: 32.4 g/dL (ref 30.0–36.0)
MCV: 94.9 fL (ref 80.0–100.0)
Monocytes Absolute: 0.7 K/uL (ref 0.1–1.0)
Monocytes Relative: 8 %
Neutro Abs: 6.3 K/uL (ref 1.7–7.7)
Neutrophils Relative %: 73 %
Platelets: 331 K/uL (ref 150–400)
RBC: 4.49 MIL/uL (ref 4.22–5.81)
RDW: 12.4 % (ref 11.5–15.5)
WBC: 8.7 K/uL (ref 4.0–10.5)
nRBC: 0 % (ref 0.0–0.2)

## 2023-11-07 NOTE — Telephone Encounter (Signed)
 Copied from CRM [id number]. Topic: Clinical - Red Word Triage >> Nov 07, 2023 10:24 AM Almira Coaster wrote: Red Word that prompted transfer to Nurse Triage: Patient's mother is calling to advise that patient is experiencing signs of kidney stones, pain in the lower back , vomiting for the last two days. Has not established care with a primary care provider as of yet.   Chief Complaint: bilateral back pain and abdominal pain Symptoms: nausea, feels like he cannot pee, moderate back pain, diarrhea Frequency: 2 days  Pertinent Negatives: Patient denies vomiting, fever Disposition: [x] ED /[] Urgent Care (no appt availability in office) / [] Appointment(In office/virtual)/ []  [place] [facility]/ [] Home Care/ [] Refused Recommended Disposition /[] [facility]/ []  Follow-up with PCP Additional Notes:   Reason for Disposition  MODERATE pain (e.g., interferes with normal activities or awakens from sleep)    Also having abd pain  Answer Assessment - Initial Assessment Questions 1. LOCATION: "Where does it hurt?" (e.g., left, right)     Both sides 2. ONSET: "When did the pain start?"     2 days 3. SEVERITY: "How bad is the pain?" (e.g., Scale 1-10; mild, moderate, or severe)   - MILD (1-3): doesn't interfere with normal activities    - MODERATE (4-7): interferes with normal activities or awakens from sleep    - SEVERE (8-10): excruciating pain and patient unable to do normal activities (stays in bed)       Moderate ache 4. PATTERN: "Does the pain come and go, or is it constant?"      *No Answer* 5. CAUSE: "What do you think is causing the pain?"     Kidney stone 6. OTHER SYMPTOMS:  "Do you have any other symptoms?" (e.g., fever, abdomen pain, vomiting, leg weakness, burning with urination, blood in urine)     Abdominal discomfort  below belly button, nausea, diarrhea, unable to void 0730- feels like to urination 7. PREGNANCY:  "Is there any chance you are pregnant?" "When was your last  menstrual period?"     N/a  Protocols used: Flank Pain-A-AH

## 2023-11-07 NOTE — ED Notes (Signed)
 Pt stated he was leaving. This RN asked Greig Right, PA if he was good to go. She stated the CT scan had not resulted but she would give pt a call if anything abnormal came back.Pt vitals are within normal limits at discharge.

## 2023-11-07 NOTE — ED Triage Notes (Signed)
 C/o bilateral flank pain that started this AM. Reports urinary symptoms. History of kidney stones and feels the same

## 2023-11-07 NOTE — ED Provider Notes (Signed)
 [facility] Provider Note    Event Date/Time   First MD Initiated Contact with Patient 11/07/23 1155     (approximate)   History   Flank Pain   HPI  Jeremiah Lee is a 33 y.o. male with history of kidney stones and gallstones presents emergency department with back pain.  Patient states pain is gone away at this time.  Mother was very concerned and encouraged him to come emergency department.  She is present with the patient.      Physical Exam   Triage Vital Signs: ED Triage Vitals  Encounter Vitals Group     BP 11/07/23 1127 107/75     Systolic BP Percentile --      Diastolic BP Percentile --      Pulse Rate 11/07/23 1127 85     Resp 11/07/23 1127 16     Temp 11/07/23 1127 98.9 F (37.2 C)     Temp Source 11/07/23 1127 Oral     SpO2 11/07/23 1127 97 %     Weight 11/07/23 1128 138 lb (62.6 kg)     Height 11/07/23 1128 5\' 9"  (1.753 m)     Head Circumference --      Peak Flow --      Pain Score 11/07/23 1128 2     Pain Loc --      Pain Education --      Exclude from Growth Chart --     Most recent vital signs: Vitals:   11/07/23 1127 11/07/23 1354  BP: 107/75 109/73  Pulse: 85 60  Resp: 16 16  Temp: 98.9 F (37.2 C) 97.6 F (36.4 C)  SpO2: 97% 97%     General: Awake, no distress.   CV:  Good peripheral perfusion. regular rate and  rhythm Resp:  Normal effort.  Abd:  No distention.  Nontender, no CVA tenderness Other:      ED Results / Procedures / Treatments   Labs (all labs ordered are listed, but only abnormal results are displayed) Labs Reviewed  URINALYSIS, ROUTINE W REFLEX MICROSCOPIC - Abnormal; Notable for the following components:      Result Value   Color, Urine YELLOW (*)    APPearance CLEAR (*)    All other components within normal limits  COMPREHENSIVE METABOLIC PANEL  CBC WITH DIFFERENTIAL/PLATELET     EKG     RADIOLOGY CT renal stone    PROCEDURES:   Procedures Chief Complaint   Patient presents with   Flank Pain      MEDICATIONS ORDERED IN ED: Medications - No data to display   IMPRESSION / MDM / ASSESSMENT AND PLAN / ED COURSE  I reviewed the triage vital signs and the nursing notes.                              Differential diagnosis includes, but is not limited to, kidney stone, acute cholecystitis, UTI, back pain  Patient's presentation is most consistent with acute illness / injury with system symptoms.   Labs are reassuring  CT renal stone  Patient became frustrated with the wait.  States not have any pain.  Would like to leave prior to CT results.  Did advise that it would be best if he stayed in case there is an abnormality.  However patient would still like to leave.  Will contact him when the CT results are available.  ----------------------------------------- 10:57 AM  on 11/08/2023 ----------------------------------------- CT report showed cholelithiasis without cholecystitis, and a very small renal stone millimeters not noted by radiologist.  I did contact patient to notify him of the results.  Since the stone is not located on the left side patient had an additional episode of pain on his left side I did tell him this is most likely moving he will be able to pass this on his own.  However he should follow-up with his urologist.  He is in agreement treatment plan.  Discharged stable condition.     FINAL CLINICAL IMPRESSION(S) / ED DIAGNOSES   Final diagnoses:  Flank pain  Kidney stone  Cholelithiasis without cholecystitis     Rx / DC Orders   ED Discharge Orders     None        Note:  This document was prepared using Dragon voice recognition software and may include unintentional dictation errors.    Faythe Ghee, PA-C 11/08/23 1058    Merwyn Katos, MD 11/08/23 [phone]
